# Patient Record
Sex: Male | Born: 1990 | Race: Black or African American | Hispanic: No | Marital: Single | Smoking: Former smoker
Health system: Southern US, Community
[De-identification: ages and names within clinical notes are randomized; demographics above are authoritative.]

---
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
## 2010-07-22 ENCOUNTER — Emergency Department: Payer: Self-pay | Admitting: Unknown Physician Specialty

## 2014-11-24 ENCOUNTER — Emergency Department
Admission: EM | Admit: 2014-11-24 | Discharge: 2014-11-24 | Disposition: A | Discharge status: Home / Self Care | Payer: Medicaid Other | Attending: Emergency Medicine | Admitting: Emergency Medicine

## 2014-11-24 ENCOUNTER — Encounter: Payer: Self-pay | Admitting: *Deleted

## 2014-11-24 DIAGNOSIS — S39012A Strain of muscle, fascia and tendon of lower back, initial encounter: Secondary | ICD-10-CM | POA: Diagnosis not present

## 2014-11-24 DIAGNOSIS — S3992XA Unspecified injury of lower back, initial encounter: Secondary | ICD-10-CM | POA: Diagnosis present

## 2014-11-24 DIAGNOSIS — Z791 Long term (current) use of non-steroidal anti-inflammatories (NSAID): Secondary | ICD-10-CM | POA: Insufficient documentation

## 2014-11-24 DIAGNOSIS — Y92410 Unspecified street and highway as the place of occurrence of the external cause: Secondary | ICD-10-CM | POA: Diagnosis not present

## 2014-11-24 DIAGNOSIS — Y998 Other external cause status: Secondary | ICD-10-CM | POA: Insufficient documentation

## 2014-11-24 DIAGNOSIS — Z720 Tobacco use: Secondary | ICD-10-CM | POA: Insufficient documentation

## 2014-11-24 DIAGNOSIS — Y9389 Activity, other specified: Secondary | ICD-10-CM | POA: Insufficient documentation

## 2014-11-24 MED ORDER — HYDROCODONE-ACETAMINOPHEN 5-325 MG PO TABS: 1.0000 | ORAL_TABLET | Freq: Three times a day (TID) | ORAL | Status: AC | PRN

## 2014-11-24 MED ORDER — CYCLOBENZAPRINE HCL 5 MG PO TABS
5.0000 mg | ORAL_TABLET | Freq: Three times a day (TID) | ORAL | Status: AC | PRN
Start: 2014-11-24 — End: ?

## 2014-11-24 MED ORDER — HYDROCODONE-ACETAMINOPHEN 5-325 MG PO TABS
2.0000 | ORAL_TABLET | Freq: Once | ORAL | Status: AC
Start: 2014-11-24 — End: 2014-11-24
  Administered 2014-11-24: 2 via ORAL
  Filled 2014-11-24: qty 2

## 2014-11-24 MED ORDER — NAPROXEN 500 MG PO TABS
500.0000 mg | ORAL_TABLET | Freq: Once | ORAL | Status: AC
  Administered 2014-11-24: 500 mg via ORAL
  Filled 2014-11-24: qty 1

## 2014-11-24 MED ORDER — NAPROXEN 500 MG PO TBEC: 500.0000 mg | DELAYED_RELEASE_TABLET | Freq: Two times a day (BID) | ORAL | Status: DC

## 2014-11-24 NOTE — ED Notes (Signed)
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
## 2014-11-26 ENCOUNTER — Emergency Department
Admission: EM | Admit: 2014-11-26 | Discharge: 2014-11-26 | Disposition: A | Discharge status: Home / Self Care | Payer: Medicaid Other | Attending: Student | Admitting: Student

## 2014-11-26 ENCOUNTER — Emergency Department: Payer: Medicaid Other

## 2014-11-26 DIAGNOSIS — IMO0001 Reserved for inherently not codable concepts without codable children: Secondary | ICD-10-CM

## 2014-11-26 DIAGNOSIS — Y998 Other external cause status: Secondary | ICD-10-CM | POA: Diagnosis not present

## 2014-11-26 DIAGNOSIS — S0990XA Unspecified injury of head, initial encounter: Secondary | ICD-10-CM | POA: Insufficient documentation

## 2014-11-26 DIAGNOSIS — Y92410 Unspecified street and highway as the place of occurrence of the external cause: Secondary | ICD-10-CM | POA: Insufficient documentation

## 2014-11-26 DIAGNOSIS — R030 Elevated blood-pressure reading, without diagnosis of hypertension: Secondary | ICD-10-CM | POA: Diagnosis not present

## 2014-11-26 DIAGNOSIS — Z720 Tobacco use: Secondary | ICD-10-CM | POA: Diagnosis not present

## 2014-11-26 DIAGNOSIS — S0083XA Contusion of other part of head, initial encounter: Secondary | ICD-10-CM | POA: Diagnosis not present

## 2014-11-26 DIAGNOSIS — S39012D Strain of muscle, fascia and tendon of lower back, subsequent encounter: Secondary | ICD-10-CM | POA: Diagnosis not present

## 2014-11-26 DIAGNOSIS — Y9389 Activity, other specified: Secondary | ICD-10-CM | POA: Insufficient documentation

## 2014-11-26 DIAGNOSIS — S3992XD Unspecified injury of lower back, subsequent encounter: Secondary | ICD-10-CM | POA: Diagnosis present

## 2014-11-26 MED ORDER — KETOROLAC TROMETHAMINE 60 MG/2ML IM SOLN
60.0000 mg | Freq: Once | INTRAMUSCULAR | Status: AC
  Administered 2014-11-26: 60 mg via INTRAMUSCULAR
  Filled 2014-11-26: qty 2

## 2014-11-26 MED ORDER — NAPROXEN 500 MG PO TABS: 500.0000 mg | ORAL_TABLET | Freq: Two times a day (BID) | ORAL | Status: AC
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
## 2014-11-26 NOTE — Discharge Instructions (Signed)
He will need to contact her doctor and make an appointment to follow-up for your elevated blood pressure. Continued to take medication as prescribed. Ice or heat to your back and muscles as needed for comfort
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
## 2019-05-16 ENCOUNTER — Other Ambulatory Visit: Payer: Self-pay

## 2019-05-16 ENCOUNTER — Emergency Department
Admission: EM | Admit: 2019-05-16 | Discharge: 2019-05-16 | Disposition: A | Discharge status: Home / Self Care | Payer: Self-pay | Attending: Emergency Medicine | Admitting: Emergency Medicine

## 2019-05-16 DIAGNOSIS — R030 Elevated blood-pressure reading, without diagnosis of hypertension: Secondary | ICD-10-CM | POA: Insufficient documentation

## 2019-05-16 DIAGNOSIS — F17200 Nicotine dependence, unspecified, uncomplicated: Secondary | ICD-10-CM | POA: Insufficient documentation

## 2019-05-16 DIAGNOSIS — J029 Acute pharyngitis, unspecified: Secondary | ICD-10-CM | POA: Insufficient documentation

## 2019-05-16 DIAGNOSIS — Z79899 Other long term (current) drug therapy: Secondary | ICD-10-CM | POA: Insufficient documentation

## 2019-05-16 DIAGNOSIS — M542 Cervicalgia: Secondary | ICD-10-CM | POA: Insufficient documentation

## 2019-05-16 LAB — GROUP A STREP BY PCR: Group A Strep by PCR: NOT DETECTED

## 2019-05-16 MED ORDER — KETOROLAC TROMETHAMINE 10 MG PO TABS: 10.0000 mg | ORAL_TABLET | Freq: Four times a day (QID) | ORAL | 0 refills | Status: AC | PRN

## 2019-05-16 MED ORDER — PREDNISONE 50 MG PO TABS: ORAL_TABLET | ORAL | 0 refills | Status: AC

## 2019-05-16 MED ORDER — KETOROLAC TROMETHAMINE 30 MG/ML IJ SOLN
30.0000 mg | Freq: Once | INTRAMUSCULAR | Status: AC
  Administered 2019-05-16: 22:00:00 30 mg via INTRAMUSCULAR
  Filled 2019-05-16: qty 1

## 2019-05-16 MED ORDER — DEXAMETHASONE 10 MG/ML FOR PEDIATRIC ORAL USE
10.0000 mg | Freq: Once | INTRAMUSCULAR | Status: AC
  Administered 2019-05-16: 22:00:00 10 mg via ORAL
  Filled 2019-05-16: qty 1

## 2019-05-16 NOTE — ED Triage Notes (Signed)
PT in with co sore throat for 2-3 days states feels like lymph noted are swollen to right side. Pt able to swallow fluids, no fever.

## 2019-05-16 NOTE — ED Provider Notes (Signed)
Emergency Department Provider Note  ____________________________________________  Time seen: Approximately 9:40 PM  I have reviewed the triage vital signs and the nursing notes.   HISTORY  Chief Complaint Sore Throat   Historian Patient    HPI Raymond Murray is a 29 y.o. male presents to the emergency department with pharyngitis for the past 3 days.  He states that he has some tender right anterior neck pain.  Patient has been able to swallow without difficulty.  He is managing his own secretions and is able to speak in complete sentences.  He denies fever and headache.  No associated rhinorrhea, nasal congestion or nonproductive cough.  No prior history of peritonsillar abscess.  No pain underneath the tongue.   No past medical history on file.   Immunizations up to date:  Yes.     No past medical history on file.  There are no problems to display for this patient.   No past surgical history on file.  Prior to Admission medications   Medication Sig Start Date End Date Taking? Authorizing Provider  cyclobenzaprine (FLEXERIL) 5 MG tablet Take 1 tablet (5 mg total) by mouth every 8 (eight) hours as needed for muscle spasms. 11/24/14   Menshew, Charlesetta Ivory, PA-C  HYDROcodone-acetaminophen (NORCO) 5-325 MG tablet Take 1-2 tablets by mouth every 8 (eight) hours as needed for moderate pain. 11/24/14   Menshew, Charlesetta Ivory, PA-C  ketorolac (TORADOL) 10 MG tablet Take 1 tablet (10 mg total) by mouth every 6 (six) hours as needed for up to 5 days. 05/16/19 05/21/19  Orvil Feil, PA-C  naproxen (NAPROSYN) 500 MG tablet Take 1 tablet (500 mg total) by mouth 2 (two) times daily with a meal. 11/26/14   Tommi Rumps, PA-C  predniSONE (DELTASONE) 50 MG tablet Take 1 tablet once daily for the next 5 days. 05/16/19   Orvil Feil, PA-C    Allergies Patient has no known allergies.  No family history on file.  Social History Social History   Tobacco Use  .  Smoking status: Current Every Day Smoker  Substance Use Topics  . Alcohol use: Yes  . Drug use: Not on file     Review of Systems  Constitutional: No fever/chills Eyes:  No discharge ENT: Patient has pharyngitis.  Respiratory: no cough. No SOB/ use of accessory muscles to breath Gastrointestinal:   No nausea, no vomiting.  No diarrhea.  No constipation. Musculoskeletal: Negative for musculoskeletal pain. Skin: Negative for rash, abrasions, lacerations, ecchymosis.   ____________________________________________   PHYSICAL EXAM:  VITAL SIGNS: ED Triage Vitals  Enc Vitals Group     BP 05/16/19 2055 (!) 160/88     Pulse Rate 05/16/19 2055 85     Resp 05/16/19 2055 20     Temp 05/16/19 2055 98.1 F (36.7 C)     Temp Source 05/16/19 2055 Oral     SpO2 05/16/19 2055 93 %     Weight 05/16/19 2055 285 lb (129.3 kg)     Height 05/16/19 2055 6\' 4"  (1.93 m)     Head Circumference --      Peak Flow --      Pain Score 05/16/19 2101 8     Pain Loc --      Pain Edu? --      Excl. in GC? --      Constitutional: Alert and oriented. Well appearing and in no acute distress. Eyes: Conjunctivae are normal. PERRL. EOMI. Head: Atraumatic. ENT:  Ears:       Nose: No congestion/rhinnorhea.      Mouth/Throat: Mucous membranes are moist.  Posterior pharynx is erythematous with tonsillar exudate on the right.  Tonsils appear symmetrical.  Uvula is midline. Neck: No stridor.  No cervical spine tenderness to palpation. Hematological/Lymphatic/Immunilogical: Palpable cervical lymphadenopathy. Cardiovascular: Normal rate, regular rhythm. Normal S1 and S2.  Good peripheral circulation. Respiratory: Normal respiratory effort without tachypnea or retractions. Lungs CTAB. Good air entry to the bases with no decreased or absent breath sounds Musculoskeletal: Full range of motion to all extremities. No obvious deformities noted Neurologic:  Normal for age. No gross focal neurologic deficits are  appreciated.  Skin:  Skin is warm, dry and intact. No rash noted. Psychiatric: Mood and affect are normal for age. Speech and behavior are normal.   ____________________________________________   LABS (all labs ordered are listed, but only abnormal results are displayed)  Labs Reviewed  GROUP A STREP BY PCR   ____________________________________________  EKG   ____________________________________________  RADIOLOGY    No results found.  ____________________________________________    PROCEDURES  Procedure(s) performed:     Procedures     Medications  ketorolac (TORADOL) 30 MG/ML injection 30 mg (30 mg Intramuscular Given 05/16/19 2155)  dexamethasone (DECADRON) 10 MG/ML injection for Pediatric ORAL use 10 mg (10 mg Oral Given 05/16/19 2152)     ____________________________________________   INITIAL IMPRESSION / ASSESSMENT AND PLAN / ED COURSE  Pertinent labs & imaging results that were available during my care of the patient were reviewed by me and considered in my medical decision making (see chart for details).    Assessment and Plan:  Pharyngitis: 29 year old male presents to the emergency department with pharyngitis for the past 3 days.  Patient was hypertensive at triage but vital signs were otherwise reassuring.  On physical exam, posterior pharynx appeared erythematous with right-sided tonsillar exudate.  Uvula was midline.  Patient had tender right-sided anterior cervical lymphadenopathy.  We performed group A strep test and administer Decadron and Toradol and will reassess.  Group A strep testing was negative.  Patient feels much better after Toradol and Decadron.  He was discharged with a 5-day burst of prednisone and oral Toradol at home for likely viral pharyngitis.  Patient declined send off COVID-19 testing at this time.  Return precautions were given to return with new or worsening symptoms.  All patient questions were  answered. ____________________________________________  FINAL CLINICAL IMPRESSION(S) / ED DIAGNOSES  Final diagnoses:  Pharyngitis, unspecified etiology      NEW MEDICATIONS STARTED DURING THIS VISIT:  ED Discharge Orders         Ordered    predniSONE (DELTASONE) 50 MG tablet     05/16/19 2327    ketorolac (TORADOL) 10 MG tablet  Every 6 hours PRN     05/16/19 2327              This chart was dictated using voice recognition software/Dragon. Despite best efforts to proofread, errors can occur which can change the meaning. Any change was purely unintentional.     Lannie Fields, PA-C 05/16/19 2330    Earleen Newport, MD 05/16/19 [phone]

## 2019-05-16 NOTE — ED Notes (Signed)
Patient c/o sore throat/pain with swallowing X 3-4 days.

## 2019-12-30 ENCOUNTER — Emergency Department: Payer: Self-pay

## 2019-12-30 ENCOUNTER — Emergency Department
Admission: EM | Admit: 2019-12-30 | Discharge: 2019-12-30 | Disposition: A | Discharge status: Home / Self Care | Payer: Self-pay | Attending: Emergency Medicine | Admitting: Emergency Medicine

## 2019-12-30 ENCOUNTER — Other Ambulatory Visit: Payer: Self-pay

## 2019-12-30 DIAGNOSIS — S62316A Displaced fracture of base of fifth metacarpal bone, right hand, initial encounter for closed fracture: Secondary | ICD-10-CM | POA: Insufficient documentation

## 2019-12-30 DIAGNOSIS — S62339A Displaced fracture of neck of unspecified metacarpal bone, initial encounter for closed fracture: Secondary | ICD-10-CM

## 2019-12-30 DIAGNOSIS — Z87891 Personal history of nicotine dependence: Secondary | ICD-10-CM | POA: Insufficient documentation

## 2019-12-30 DIAGNOSIS — W228XXA Striking against or struck by other objects, initial encounter: Secondary | ICD-10-CM | POA: Insufficient documentation

## 2019-12-30 NOTE — ED Notes (Signed)
Ulnar gutter; 5th metacarpal fx applied.

## 2019-12-30 NOTE — ED Notes (Signed)
See triage note. Pt presents to the ED in for medical clearance. Pt having pain in his R hand. Pt is A&Ox4 and NAD. [employer] at bedside.
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
## 2019-12-30 NOTE — Discharge Instructions (Addendum)
Use ibuprofen 600mg  as needed for pain.

## 2019-12-30 NOTE — ED Triage Notes (Signed)
PT to ED with [employer] for medical clearance. PT hit his Right hand and is having pain to pinky and ring finger/knuckles. No other pain sites.
